# Patient Record
Sex: Female | Born: 1985 | Race: Black or African American | Hispanic: No | Marital: Single | Smoking: Never smoker
Health system: Southern US, Community
[De-identification: ages and names within clinical notes are randomized; demographics above are authoritative.]

---

## 2019-08-22 ENCOUNTER — Encounter (HOSPITAL_COMMUNITY): Payer: Self-pay | Admitting: Emergency Medicine

## 2019-08-22 ENCOUNTER — Emergency Department (HOSPITAL_COMMUNITY)
Admission: EM | Admit: 2019-08-22 | Discharge: 2019-08-22 | Disposition: A | Discharge status: Home / Self Care | Payer: Self-pay | Attending: Emergency Medicine | Admitting: Emergency Medicine

## 2019-08-22 ENCOUNTER — Other Ambulatory Visit: Payer: Self-pay

## 2019-08-22 DIAGNOSIS — R531 Weakness: Secondary | ICD-10-CM | POA: Insufficient documentation

## 2019-08-22 DIAGNOSIS — Z1152 Encounter for screening for COVID-19: Secondary | ICD-10-CM

## 2019-08-22 DIAGNOSIS — Z20822 Contact with and (suspected) exposure to covid-19: Secondary | ICD-10-CM | POA: Insufficient documentation

## 2019-08-22 NOTE — ED Triage Notes (Addendum)
Arrived via [facility] from public location [facility] X 6 years with occasional abd pain. Pt denies SOB fever cough LOC or syncope. Pt ambulatory on scene A/OX4.

## 2019-08-22 NOTE — ED Notes (Signed)
Rn gave pt discharge instructions. Pt asks repeatedly about Covid testing and how the tests are performed. Pt states that she has gum cancer and that nobody is sticking anything up her nose. 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 Pt asks if we are able to call another ambulance to take her to the next hospital. Pt advised that the next hospitals in the area are [facility] facilities and they would not provide that type of Covid test either.  Pt advised RN and this Clinical research associate that she has been having issues with the [employer] and several proscpective employers wanting her to get a Covid test and that she wants a note stating that this hospital system only does nasal Covid swabs. Rn advises that the only note she can give is a Doctor's note stating that she was seen in the ED. Pt educated by [employer]. Pt able to get dressed on her own and left without issue.

## 2019-08-22 NOTE — ED Provider Notes (Signed)
[facility]-EMERGENCY DEPT Provider Note   CSN: [id number] Arrival date & time: 08/22/19  2023     History Chief Complaint  Patient presents with  . Weakness    General     Diana Bates is a 34 y.o. female.  Patient presents to the ED with a chief complaint of requesting COVID-19 test.  She states that she has been feeling fatigued for the past 6 years.  She denies fever, SOB, cough.  She is requesting rectal covid test.    The history is provided by the patient. No language interpreter was used.       No past medical history on file.  There are no problems to display for this patient.   History reviewed. No pertinent surgical history.   OB History   No obstetric history on file.     No family history on file.  Social History   Tobacco Use  . Smoking status: Not on file  Substance Use Topics  . Alcohol use: Not on file  . Drug use: Not on file    Home Medications Prior to Admission medications   Medication Sig Start Date End Date Taking? Authorizing Provider  acetaminophen (TYLENOL) 325 MG tablet Take 650 mg by mouth 2 (two) times daily as needed for mild pain.   Yes [provider]  naproxen (NAPROSYN) 500 MG tablet Take 500 mg by mouth 2 (two) times daily as needed for mild pain.   Yes [provider]    Allergies    Patient has no known allergies.  Review of Systems   Review of Systems  Constitutional: Positive for fatigue. Negative for fever.  Respiratory: Negative for cough and shortness of breath.     Physical Exam Updated Vital Signs BP 106/69   Pulse (!) 103   Temp 98.1 F (36.7 C)   Resp 18   SpO2 100%   Physical Exam Vitals and nursing note reviewed.  Constitutional:      General: She is not in acute distress.    Appearance: She is well-developed.  HENT:     Head: Normocephalic and atraumatic.  Eyes:     Conjunctiva/sclera: Conjunctivae normal.  Cardiovascular:     Rate and Rhythm:  Normal rate.     Heart sounds: No murmur.  Pulmonary:     Effort: Pulmonary effort is normal. No respiratory distress.  Abdominal:     General: There is no distension.  Musculoskeletal:     Cervical back: Neck supple.     Comments: Moves all extremities  Skin:    General: Skin is warm and dry.  Neurological:     Mental Status: She is alert and oriented to person, place, and time.     ED Results / Procedures / Treatments   Labs (all labs ordered are listed, but only abnormal results are displayed) Labs Reviewed - No data to display  EKG None  Radiology No results found.  Procedures Procedures (including critical care time)  Medications Ordered in ED Medications - No data to display  ED Course  I have reviewed the triage vital signs and the nursing notes.  Pertinent labs & imaging results that were available during my care of the patient were reviewed by me and considered in my medical decision making (see chart for details).    MDM Rules/Calculators/A&P                      Patient here requesting COVID-19 screening.  She is noted to be afebrile, has normal O2 saturation, and is in no respiratory distress.  Additionally, she states that she has been feeling fatigued for the past 6 years.  She refuses Covid test unless it is done rectally. I told her that we don't do it that way here. She is then refused any other care from me.   Appears stable for discharge.  Medical screening exam complete.   Final Clinical Impression(s) / ED Diagnoses Final diagnoses:  Encounter for screening for COVID-19    Rx / DC Orders ED Discharge Orders    None       Montine Circle, PA-C 08/22/19 2226    Ezequiel Essex, MD 08/22/19 2341

## 2019-08-22 NOTE — ED Notes (Signed)
Patient stating she is having no symptoms of Covid-19 but wanting to be swabbed because she is tired of everyone talking about it. Patient stating she has gum cancer and does not want a Covid-19 swab in her nose, she wants it in her rectum. This nurse, EMT Kady, and provider have all explained that we only test by nasal swab. Patient stating they have been doing rectal Covid swabs for years. Education provided and patient agreed with discharge.

## 2019-08-23 ENCOUNTER — Other Ambulatory Visit: Payer: Self-pay

## 2019-08-23 DIAGNOSIS — R1012 Left upper quadrant pain: Secondary | ICD-10-CM | POA: Insufficient documentation

## 2019-08-23 NOTE — ED Notes (Signed)
Called pt x1 from lobby No response 

## 2019-08-24 ENCOUNTER — Emergency Department (HOSPITAL_COMMUNITY)
Admission: EM | Admit: 2019-08-24 | Discharge: 2019-08-24 | Disposition: A | Discharge status: Home / Self Care | Payer: Self-pay | Attending: Emergency Medicine | Admitting: Emergency Medicine

## 2019-08-24 ENCOUNTER — Encounter: Payer: Self-pay | Admitting: Emergency Medicine

## 2019-08-24 DIAGNOSIS — R1012 Left upper quadrant pain: Secondary | ICD-10-CM

## 2019-08-24 LAB — URINALYSIS, ROUTINE W REFLEX MICROSCOPIC
Bilirubin Urine: NEGATIVE
Glucose, UA: NEGATIVE mg/dL
Ketones, ur: NEGATIVE mg/dL
Leukocytes,Ua: NEGATIVE
Nitrite: NEGATIVE
Protein, ur: NEGATIVE mg/dL
Specific Gravity, Urine: 1.004 — ABNORMAL LOW (ref 1.005–1.030)
pH: 6 (ref 5.0–8.0)

## 2019-08-24 LAB — COMPREHENSIVE METABOLIC PANEL
ALT: 14 U/L (ref 0–44)
AST: 20 U/L (ref 15–41)
Albumin: 4.2 g/dL (ref 3.5–5.0)
Alkaline Phosphatase: 96 U/L (ref 38–126)
Anion gap: 8 (ref 5–15)
BUN: 16 mg/dL (ref 6–20)
CO2: 26 mmol/L (ref 22–32)
Calcium: 9.2 mg/dL (ref 8.9–10.3)
Chloride: 105 mmol/L (ref 98–111)
Creatinine, Ser: 0.83 mg/dL (ref 0.44–1.00)
GFR calc Af Amer: >60 mL/min (ref >60)
GFR calc non Af Amer: >60 mL/min (ref >60)
Glucose, Bld: 87 mg/dL (ref 70–99)
Potassium: 3.5 mmol/L (ref 3.5–5.1)
Sodium: 139 mmol/L (ref 135–145)
Total Bilirubin: 0.6 mg/dL (ref 0.3–1.2)
Total Protein: 7.5 g/dL (ref 6.5–8.1)

## 2019-08-24 LAB — CBC WITH DIFFERENTIAL/PLATELET
Abs Immature Granulocytes: 0.03 10*3/uL (ref 0.00–0.07)
Basophils Absolute: 0 10*3/uL (ref 0.0–0.1)
Basophils Relative: 0 %
Eosinophils Absolute: 0.2 10*3/uL (ref 0.0–0.5)
Eosinophils Relative: 2 %
HCT: 34.9 % — ABNORMAL LOW (ref 36.0–46.0)
Hemoglobin: 10.4 g/dL — ABNORMAL LOW (ref 12.0–15.0)
Immature Granulocytes: 0 %
Lymphocytes Relative: 30 %
Lymphs Abs: 2.7 10*3/uL (ref 0.7–4.0)
MCH: 24.1 pg — ABNORMAL LOW (ref 26.0–34.0)
MCHC: 29.8 g/dL — ABNORMAL LOW (ref 30.0–36.0)
MCV: 81 fL (ref 80.0–100.0)
Monocytes Absolute: 0.6 10*3/uL (ref 0.1–1.0)
Monocytes Relative: 7 %
Neutro Abs: 5.3 10*3/uL (ref 1.7–7.7)
Neutrophils Relative %: 61 %
Platelets: 379 10*3/uL (ref 150–400)
RBC: 4.31 MIL/uL (ref 3.87–5.11)
RDW: 15.9 % — ABNORMAL HIGH (ref 11.5–15.5)
WBC: 8.9 10*3/uL (ref 4.0–10.5)
nRBC: 0 % (ref 0.0–0.2)

## 2019-08-24 LAB — LIPASE, BLOOD: Lipase: 28 U/L (ref 11–51)

## 2019-08-24 MED ORDER — ALUM & MAG HYDROXIDE-SIMETH 200-200-20 MG/5ML PO SUSP
30.0000 mL | Freq: Once | ORAL | Status: AC
  Administered 2019-08-24: 30 mL via ORAL
  Filled 2019-08-24: qty 30

## 2019-08-24 MED ORDER — ONDANSETRON 4 MG PO TBDP
4.0000 mg | ORAL_TABLET | Freq: Once | ORAL | Status: AC
  Administered 2019-08-24: 4 mg via ORAL
  Filled 2019-08-24: qty 1

## 2019-08-24 NOTE — ED Triage Notes (Signed)
Pt arriving with abdominal pain. Pt giving vague answers when asked to describe the pain or what may have caused it. Pt reports not trying any medications at home to relieve symptoms prior to arrival.

## 2019-08-24 NOTE — Discharge Instructions (Addendum)

## 2019-08-24 NOTE — ED Provider Notes (Signed)
[facility]-EMERGENCY DEPT Provider Note  CSN: [id number] Arrival date & time: 08/23/19 2122  Chief Complaint(s) Abdominal Pain  HPI Diana Bates is a 34 y.o. female   The history is provided by the patient.  Abdominal Pain Pain location:  Generalized Pain quality: aching   Pain radiates to:  Does not radiate Pain severity:  Moderate Onset quality:  Gradual Duration:  1 day Timing:  Constant Progression:  Waxing and waning Chronicity:  Recurrent Relieved by:  Nothing Worsened by:  Nothing Associated symptoms: fatigue, nausea and vomiting   Associated symptoms: no chills, no cough, no diarrhea, no shortness of breath and no sore throat     Past Medical History No past medical history on file. There are no problems to display for this patient.  Home Medication(s) Prior to Admission medications   Medication Sig Start Date End Date Taking? Authorizing Provider  acetaminophen (TYLENOL) 325 MG tablet Take 650 mg by mouth 2 (two) times daily as needed for mild pain.   Yes [provider]  naproxen (NAPROSYN) 500 MG tablet Take 500 mg by mouth 2 (two) times daily as needed for mild pain.   Yes [provider]                                                                                                                                    Past Surgical History No past surgical history on file. Family History No family history on file.  Social History Social History   Tobacco Use  . Smoking status: Not on file  Substance Use Topics  . Alcohol use: Not on file  . Drug use: Not on file   Allergies Patient has no known allergies.  Review of Systems Review of Systems  Constitutional: Positive for fatigue. Negative for chills.  HENT: Negative for sore throat.   Respiratory: Negative for cough and shortness of breath.   Gastrointestinal: Positive for abdominal pain, nausea and vomiting. Negative for diarrhea.   All other systems  are reviewed and are negative for acute change except as noted in the HPI  Physical Exam Vital Signs  I have reviewed the triage vital signs BP 130/85   Pulse 69   Temp 98 F (36.7 C)   Resp 20   Ht 5\' 2"  (1.575 m)   Wt 81.6 kg   LMP 08/24/2019   SpO2 100%   BMI 32.92 kg/m   Physical Exam Vitals reviewed.  Constitutional:      General: She is not in acute distress.    Appearance: She is well-developed. She is not diaphoretic.  HENT:     Head: Normocephalic and atraumatic.     Right Ear: External ear normal.     Left Ear: External ear normal.     Nose: Nose normal.  Eyes:     General: No scleral icterus.    Conjunctiva/sclera: Conjunctivae normal.  Neck:  Trachea: Phonation normal.  Cardiovascular:     Rate and Rhythm: Normal rate and regular rhythm.  Pulmonary:     Effort: Pulmonary effort is normal. No respiratory distress.     Breath sounds: No stridor.  Abdominal:     General: There is no distension.     Tenderness: There is abdominal tenderness in the left upper quadrant. There is no guarding or rebound. Negative signs include Murphy's sign and McBurney's sign.  Musculoskeletal:        General: Normal range of motion.     Cervical back: Normal range of motion.  Neurological:     Mental Status: She is alert and oriented to person, place, and time.  Psychiatric:        Behavior: Behavior normal.     ED Results and Treatments Labs (all labs ordered are listed, but only abnormal results are displayed) Labs Reviewed  CBC WITH DIFFERENTIAL/PLATELET - Abnormal; Notable for the following components:      Result Value   Hemoglobin 10.4 (*)    HCT 34.9 (*)    MCH 24.1 (*)    MCHC 29.8 (*)    RDW 15.9 (*)    All other components within normal limits  URINALYSIS, ROUTINE W REFLEX MICROSCOPIC - Abnormal; Notable for the following components:   Color, Urine STRAW (*)    Specific Gravity, Urine 1.004 (*)    Hgb urine dipstick LARGE (*)    Bacteria, UA RARE  (*)    All other components within normal limits  COMPREHENSIVE METABOLIC PANEL  LIPASE, BLOOD                                                                                                                         EKG  EKG Interpretation  Date/Time:    Ventricular Rate:    PR Interval:    QRS Duration:   QT Interval:    QTC Calculation:   R Axis:     Text Interpretation:        Radiology No results found.  Pertinent labs & imaging results that were available during my care of the patient were reviewed by me and considered in my medical decision making (see chart for details).  Medications Ordered in ED Medications  ondansetron (ZOFRAN-ODT) disintegrating tablet 4 mg (4 mg Oral Given 08/24/19 0409)  alum & mag hydroxide-simeth (MAALOX/MYLANTA) 200-200-20 MG/5ML suspension 30 mL (30 mLs Oral Given 08/24/19 0409)  Procedures Procedures  (including critical care time)  Medical Decision Making / ED Course I have reviewed the nursing notes for this encounter and the patient's prior records (if available in EHR or on provided paperwork).   Diana Bates was evaluated in Emergency Department on 08/24/2019 for the symptoms described in the history of present illness. She was evaluated in the context of the global COVID-19 pandemic, which necessitated consideration that the patient might be at risk for infection with the SARS-CoV-2 virus that causes COVID-19. Institutional protocols and algorithms that pertain to the evaluation of patients at risk for COVID-19 are in a state of rapid change based on information released by regulatory bodies including the CDC and federal and state organizations. These policies and algorithms were followed during the patient's care in the ED.  Patient presents with abdominal discomfort.  Mild discomfort to palpation in the left  upper quadrant.  The rest of the abdomen is benign.  Labs grossly reassuring without leukocytosis or significant anemia.  No electrolyte derangements or renal insufficiency.  No evidence of bili obstruction or pancreatitis.  Pain resolved with GI cocktail.  Low suspicion for serious intra-abdominal findings or/infectious process requiring imaging at this time.  The patient appears reasonably screened and/or stabilized for discharge and I doubt any other medical condition or other [facility] requiring further screening, evaluation, or treatment in the ED at this time prior to discharge.  The patient is safe for discharge with strict return precautions.       Final Clinical Impression(s) / ED Diagnoses Final diagnoses:  Left upper quadrant abdominal pain    The patient appears reasonably screened and/or stabilized for discharge and I doubt any other medical condition or other [facility] requiring further screening, evaluation, or treatment in the ED at this time prior to discharge.  Disposition: Discharge  Condition: Good  I have discussed the results, Dx and Tx plan with the patient who expressed understanding and agree(s) with the plan. Discharge instructions discussed at great length. The patient was given strict return precautions who verbalized understanding of the instructions. No further questions at time of discharge.    ED Discharge Orders    None       Follow Up: Primary care provider  Schedule an appointment as soon as possible for a visit  If you do not have a primary care physician, contact [facility] at [phone] for referral      This chart was dictated using voice recognition software.  Despite best efforts to proofread,  errors can occur which can change the documentation meaning.   Nira Conn, MD 08/24/19 [phone]

## 2019-08-25 ENCOUNTER — Encounter (HOSPITAL_COMMUNITY): Payer: Self-pay | Admitting: *Deleted

## 2019-08-25 ENCOUNTER — Emergency Department (EMERGENCY_DEPARTMENT_HOSPITAL)
Admission: AD | Admit: 2019-08-25 | Discharge: 2019-08-25 | Disposition: A | Discharge status: Home / Self Care | Payer: Self-pay | Source: Home / Self Care | Admitting: Obstetrics & Gynecology

## 2019-08-25 ENCOUNTER — Emergency Department (HOSPITAL_COMMUNITY)
Admission: EM | Admit: 2019-08-25 | Discharge: 2019-08-26 | Disposition: A | Discharge status: Home / Self Care | Payer: Self-pay | Attending: Emergency Medicine | Admitting: Emergency Medicine

## 2019-08-25 ENCOUNTER — Other Ambulatory Visit: Payer: Self-pay

## 2019-08-25 DIAGNOSIS — R519 Headache, unspecified: Secondary | ICD-10-CM | POA: Insufficient documentation

## 2019-08-25 DIAGNOSIS — R1032 Left lower quadrant pain: Secondary | ICD-10-CM | POA: Insufficient documentation

## 2019-08-25 DIAGNOSIS — R109 Unspecified abdominal pain: Secondary | ICD-10-CM

## 2019-08-25 DIAGNOSIS — G8929 Other chronic pain: Secondary | ICD-10-CM | POA: Insufficient documentation

## 2019-08-25 DIAGNOSIS — Z3202 Encounter for pregnancy test, result negative: Secondary | ICD-10-CM

## 2019-08-25 LAB — URINALYSIS, ROUTINE W REFLEX MICROSCOPIC
Bilirubin Urine: NEGATIVE
Glucose, UA: NEGATIVE mg/dL
Ketones, ur: 5 mg/dL — AB
Nitrite: NEGATIVE
Protein, ur: 30 mg/dL — AB
Specific Gravity, Urine: 1.03 (ref 1.005–1.030)
pH: 5 (ref 5.0–8.0)

## 2019-08-25 LAB — COMPREHENSIVE METABOLIC PANEL
ALT: 14 U/L (ref 0–44)
AST: 27 U/L (ref 15–41)
Albumin: 3.7 g/dL (ref 3.5–5.0)
Alkaline Phosphatase: 111 U/L (ref 38–126)
Anion gap: 12 (ref 5–15)
BUN: 11 mg/dL (ref 6–20)
CO2: 23 mmol/L (ref 22–32)
Calcium: 9.1 mg/dL (ref 8.9–10.3)
Chloride: 106 mmol/L (ref 98–111)
Creatinine, Ser: 0.77 mg/dL (ref 0.44–1.00)
GFR calc Af Amer: >60 mL/min (ref >60)
GFR calc non Af Amer: >60 mL/min (ref >60)
Glucose, Bld: 127 mg/dL — ABNORMAL HIGH (ref 70–99)
Potassium: 3.8 mmol/L (ref 3.5–5.1)
Sodium: 141 mmol/L (ref 135–145)
Total Bilirubin: 0.6 mg/dL (ref 0.3–1.2)
Total Protein: 6.4 g/dL — ABNORMAL LOW (ref 6.5–8.1)

## 2019-08-25 LAB — CBC
HCT: 32.4 % — ABNORMAL LOW (ref 36.0–46.0)
Hemoglobin: 9.7 g/dL — ABNORMAL LOW (ref 12.0–15.0)
MCH: 24 pg — ABNORMAL LOW (ref 26.0–34.0)
MCHC: 29.9 g/dL — ABNORMAL LOW (ref 30.0–36.0)
MCV: 80.2 fL (ref 80.0–100.0)
Platelets: 370 10*3/uL (ref 150–400)
RBC: 4.04 MIL/uL (ref 3.87–5.11)
RDW: 15.6 % — ABNORMAL HIGH (ref 11.5–15.5)
WBC: 9.8 10*3/uL (ref 4.0–10.5)
nRBC: 0 % (ref 0.0–0.2)

## 2019-08-25 LAB — LIPASE, BLOOD: Lipase: 18 U/L (ref 11–51)

## 2019-08-25 LAB — POCT PREGNANCY, URINE: Preg Test, Ur: NEGATIVE

## 2019-08-25 MED ORDER — SODIUM CHLORIDE 0.9% FLUSH: 3.0000 mL | Freq: Once | INTRAVENOUS | Status: DC

## 2019-08-25 NOTE — ED Triage Notes (Signed)
Pt presents with lower abdominal pain "for months". Seen for the same yesterday in the [facility] ED, says the pain is "worse and not changed". No vaginal bleeding or discharge. No urinary symptoms. Has had vomiting today.

## 2019-08-25 NOTE — MAU Note (Signed)
Pt reports to MAU for stomach pain,  pelvic pain, and a headache located in the back of her head.

## 2019-08-25 NOTE — MAU Provider Note (Signed)
First Provider Initiated Contact with Patient 08/25/19 2111      S Ms. Diana Bates is a 34 y.o.  non-pregnant female who presents to MAU today with complaint of abdominal pain & requesting a pregnancy test. She was seen in the ED yesterday with complaint of abdominal pain. Had negative workup. Pregnancy test was not done at that time. States abdominal pain has not worsened or changed since her evaluation yesterday.   O BP 130/79 (BP Location: Right Arm)   Pulse 90   Temp 98.3 F (36.8 C) (Oral)   Resp 20   LMP 08/24/2019   SpO2 100%  Physical Exam  Nursing note and vitals reviewed. Constitutional: She appears well-developed and well-nourished. No distress.  Respiratory: Effort normal. No respiratory distress.  Skin: She is not diaphoretic.  Psychiatric: She has a normal mood and affect. Her behavior is normal.    A Non pregnant female Medical screening exam complete 1. Abdominal pain in female   2. Negative pregnancy test   -vital signs stable. Pregnancy test negative. Patient reports no worsening of symptoms.    P Discharge from MAU in stable condition Patient given the option of transfer to [facility] for further evaluation or seek care in outpatient facility of choice List of options for follow-up given  Warning signs for worsening condition that would warrant emergency follow-up discussed Patient may return to MAU as needed for pregnancy related complaints  Judeth Horn, NP 08/25/2019 9:11 PM

## 2019-08-26 ENCOUNTER — Other Ambulatory Visit: Payer: Self-pay

## 2019-08-26 ENCOUNTER — Emergency Department (HOSPITAL_COMMUNITY)
Admission: EM | Admit: 2019-08-26 | Discharge: 2019-08-27 | Disposition: A | Discharge status: Home / Self Care | Payer: Self-pay | Attending: Emergency Medicine | Admitting: Emergency Medicine

## 2019-08-26 DIAGNOSIS — F22 Delusional disorders: Secondary | ICD-10-CM | POA: Insufficient documentation

## 2019-08-26 DIAGNOSIS — R109 Unspecified abdominal pain: Secondary | ICD-10-CM | POA: Insufficient documentation

## 2019-08-26 DIAGNOSIS — R519 Headache, unspecified: Secondary | ICD-10-CM | POA: Insufficient documentation

## 2019-08-26 NOTE — ED Provider Notes (Signed)
[facility] EMERGENCY DEPARTMENT Provider Note   CSN: [id number] Arrival date & time: 08/25/19  2131     History Chief Complaint  Patient presents with  . Abdominal Pain    Diana Bates is a 34 y.o. female with no significant past medical history who presents to the emergency department with a chief complaint of abdominal pain.  The patient reports that she has been having left lower quadrant abdominal pain that has been intermittent "for a long time".  She later states that it has been going on for years.  She is unsure if it is gotten worse recently, but just wanted to be reevaluated because she has not been feeling well.  She also reports that she developed a headache earlier today.  She denies fever, chills, cough, shortness of breath, chest pain, back pain, nausea, vomiting, diarrhea, constipation, dysuria, hematuria, vaginal pain, or discharge.  LMP 08/24/19.  Reports that she has not been sexually active in several months.  She is not currently established with a primary care provider or OB/GYN.  Reports that she is to have a history of ovarian cyst.  She states that she cannot tell me if she has had any other chronic medical conditions "because I'm not a doctor."  She does not take any daily medications.  The history is provided by the patient. No language interpreter was used.       History reviewed. No pertinent past medical history.  There are no problems to display for this patient.   History reviewed. No pertinent surgical history.   OB History   No obstetric history on file.     No family history on file.  Social History   Tobacco Use  . Smoking status: Not on file  Substance Use Topics  . Alcohol use: Not on file  . Drug use: Not on file    Home Medications Prior to Admission medications   Medication Sig Start Date End Date Taking? Authorizing Provider  acetaminophen (TYLENOL) 325 MG tablet Take 650 mg by mouth 2 (two) times daily  as needed for mild pain.   Yes [provider]  naproxen (NAPROSYN) 500 MG tablet Take 500 mg by mouth 2 (two) times daily as needed for mild pain.   Yes [provider]    Allergies    Patient has no known allergies.  Review of Systems   Review of Systems  Constitutional: Negative for activity change, chills and fever.  Respiratory: Negative for shortness of breath and wheezing.   Cardiovascular: Negative for chest pain and palpitations.  Gastrointestinal: Negative for abdominal pain, constipation, diarrhea, nausea and vomiting.  Genitourinary: Negative for decreased urine volume, dysuria, flank pain, frequency, genital sores, hematuria, pelvic pain, vaginal bleeding, vaginal discharge and vaginal pain.  Musculoskeletal: Negative for back pain and myalgias.  Skin: Negative for rash.  Allergic/Immunologic: Negative for immunocompromised state.  Neurological: Positive for headaches. Negative for dizziness, syncope, weakness, light-headedness and numbness.  Psychiatric/Behavioral: Negative for confusion.    Physical Exam Updated Vital Signs BP 113/74   Pulse 76   Temp 98.2 F (36.8 C) (Oral)   Resp 19   LMP 08/24/2019   SpO2 100%   Physical Exam Vitals and nursing note reviewed.  Constitutional:      General: She is not in acute distress.    Appearance: She is not ill-appearing, toxic-appearing or diaphoretic.  HENT:     Head: Normocephalic.  Eyes:     Conjunctiva/sclera: Conjunctivae normal.  Cardiovascular:  Rate and Rhythm: Normal rate and regular rhythm.     Heart sounds: No murmur. No friction rub. No gallop.   Pulmonary:     Effort: Pulmonary effort is normal. No respiratory distress.     Breath sounds: No stridor. No wheezing, rhonchi or rales.  Chest:     Chest wall: No tenderness.  Abdominal:     General: There is no distension.     Palpations: Abdomen is soft. There is no mass.     Tenderness: There is abdominal tenderness. There is no  right CVA tenderness, left CVA tenderness, guarding or rebound.     Hernia: No hernia is present.     Comments: Tender to palpation in the left lower quadrant.  No rebound or guarding.  No suprapubic tenderness.  No CVA tenderness bilaterally.  Normoactive bowel sounds.  Negative Murphy sign.  No tenderness over McBurney's point.  Musculoskeletal:     Cervical back: Neck supple.  Skin:    General: Skin is warm.     Findings: No rash.  Neurological:     Mental Status: She is alert.  Psychiatric:        Behavior: Behavior normal.     ED Results / Procedures / Treatments   Labs (all labs ordered are listed, but only abnormal results are displayed) Labs Reviewed  COMPREHENSIVE METABOLIC PANEL - Abnormal; Notable for the following components:      Result Value   Glucose, Bld 127 (*)    Total Protein 6.4 (*)    All other components within normal limits  CBC - Abnormal; Notable for the following components:   Hemoglobin 9.7 (*)    HCT 32.4 (*)    MCH 24.0 (*)    MCHC 29.9 (*)    RDW 15.6 (*)    All other components within normal limits  URINALYSIS, ROUTINE W REFLEX MICROSCOPIC - Abnormal; Notable for the following components:   APPearance HAZY (*)    Hgb urine dipstick MODERATE (*)    Ketones, ur 5 (*)    Protein, ur 30 (*)    Leukocytes,Ua MODERATE (*)    Bacteria, UA RARE (*)    All other components within normal limits  URINE CULTURE  LIPASE, BLOOD    EKG None  Radiology No results found.  Procedures Procedures (including critical care time)  Medications Ordered in ED Medications  sodium chloride flush (NS) 0.9 % injection 3 mL (has no administration in time range)    ED Course  I have reviewed the triage vital signs and the nursing notes.  Pertinent labs & imaging results that were available during my care of the patient were reviewed by me and considered in my medical decision making (see chart for details).    MDM Rules/Calculators/A&P                       34 year old female who presents for her fourth ER visit this week.  She has new order healthcare system.  Per chart review, it appears that she suffers from homelessness.  She is here to be evaluated today, again, for abdominal pain.  No GU symptoms.  No constitutional symptoms.  Vital signs are normal.  She does have anemia, I suspect this is probably chronic secondary to heavy menstrual cycles.  She is currently on her menstrual cycle, but no dizziness or lightheadedness.  UA with moderate leukocyte esterase.  This is new from yesterday, but it also may be secondary to contamination  as there are several squamous cells.  Will send for culture and defer treatment at this time as she is not having any urinary complaints.  The patient was offered Tylenol or ibuprofen for her symptoms today, but declined and states "can't you give me something stronger".  At this time, I feel that the patient is hemodynamically stable and in no acute distress.  Doubt appendicitis, diverticulitis, cholecystitis, pancreatitis, ovarian torsion given the chronicity of her symptoms, ectopic pregnancy, or pyelonephritis.  She is hemodynamically stable and in no acute distress.  Safe for discharge with outpatient follow-up.  Final Clinical Impression(s) / ED Diagnoses Final diagnoses:  Chronic abdominal pain  Acute nonintractable headache, unspecified headache type    Rx / DC Orders ED Discharge Orders    None       Joanne Gavel, PA-C 08/26/19 0417    Maudie Flakes, MD 08/31/19 [phone]

## 2019-08-26 NOTE — ED Notes (Signed)
This RN attempted to remove pt bracelet off of pt wrist. Pt ripped her arm away from RN and said "oh no, I am keeping this on because I am still here. Get out of my face."

## 2019-08-26 NOTE — ED Triage Notes (Signed)
Per pt she has been having a headache and was here last night and was not given any med. All strengths equal. No blurred no cough, no congestion

## 2019-08-26 NOTE — Discharge Instructions (Addendum)
Thank you for allowing me to care for you today in the Emergency Department.   You were seen today for lower abdominal pain that you have been having for some time.  You were also evaluated for headache that you developed earlier today.  You declined Tylenol and ibuprofen in the ER.  Call to get established with an OB/GYN to follow-up for your chronic abdominal pain.  Return to the emergency department if you pass out, if you develop uncontrollable bleeding, high fevers with severe abdominal pain, rectal bleeding, uncontrollable vomiting, or other new, concerning symptoms.

## 2019-08-27 ENCOUNTER — Emergency Department (HOSPITAL_COMMUNITY): Payer: Self-pay

## 2019-08-27 ENCOUNTER — Encounter (HOSPITAL_COMMUNITY): Payer: Self-pay | Admitting: Emergency Medicine

## 2019-08-27 ENCOUNTER — Other Ambulatory Visit: Payer: Self-pay

## 2019-08-27 ENCOUNTER — Emergency Department (HOSPITAL_COMMUNITY)
Admission: EM | Admit: 2019-08-27 | Discharge: 2019-08-27 | Disposition: A | Discharge status: Home / Self Care | Payer: Self-pay | Attending: Emergency Medicine | Admitting: Emergency Medicine

## 2019-08-27 DIAGNOSIS — Z79899 Other long term (current) drug therapy: Secondary | ICD-10-CM | POA: Insufficient documentation

## 2019-08-27 DIAGNOSIS — R519 Headache, unspecified: Secondary | ICD-10-CM | POA: Insufficient documentation

## 2019-08-27 LAB — URINE CULTURE

## 2019-08-27 MED ORDER — ACETAMINOPHEN 325 MG PO TABS
650.0000 mg | ORAL_TABLET | Freq: Once | ORAL | Status: AC
  Administered 2019-08-27: 650 mg via ORAL
  Filled 2019-08-27: qty 2

## 2019-08-27 MED ORDER — KETOROLAC TROMETHAMINE 60 MG/2ML IM SOLN
30.0000 mg | Freq: Once | INTRAMUSCULAR | Status: AC
  Administered 2019-08-27: 01:00:00 30 mg via INTRAMUSCULAR
  Filled 2019-08-27: qty 2

## 2019-08-27 MED ORDER — KETOROLAC TROMETHAMINE 60 MG/2ML IM SOLN
60.0000 mg | Freq: Once | INTRAMUSCULAR | Status: AC
  Administered 2019-08-27: 60 mg via INTRAMUSCULAR
  Filled 2019-08-27: qty 2

## 2019-08-27 NOTE — ED Triage Notes (Signed)
Patient here from home with complaints of headache, chills, body aches that started 4 days ago. States that she was seen for same at [facility] twice.would like covid test.

## 2019-08-27 NOTE — Discharge Instructions (Addendum)
You were seen today for headache and cough. Your chest xray was normal! We have given you some medications for your headache. Because these may be early symptoms of covid 19, you should stay home and isolate yourself for 10 days from your symptoms start date and until you are improved and fever free. Take tylenol for your symptoms. Thank you for allowing me to care for you today. Please return to the emergency department if you have new or worsening symptoms. Take your medications as instructed.

## 2019-08-27 NOTE — ED Provider Notes (Signed)
[facility] DEPT Provider Note   CSN: [id number] Arrival date & time: 08/27/19  2025     History Chief Complaint  Patient presents with  . Headache  . Chills    Diana Bates is a 34 y.o. female.  Patient is a 34 year old female with no listed past medical history presenting to the emergency department for headache, body aches and requesting Covid testing.  This appears to be patient's fifth or sixth visit in the last 5 days to the emergency department for various complaints including abdominal pain and Covid symptoms.  Previous Covid test negative and previous lab work-ups are unremarkable.  She reports that her symptoms have been going on for "a few days".  Reports generalized body aches and fatigue and a slight cough with a headache.  She denies any neurological symptoms.  Denies any fever, chills, nausea, vomiting, dysuria, abdominal pain, chest pain, shortness of breath.  She reports she has not taken anything today for relief of her headache.        History reviewed. No pertinent past medical history.  There are no problems to display for this patient.   History reviewed. No pertinent surgical history.   OB History   No obstetric history on file.     No family history on file.  Social History   Tobacco Use  . Smoking status: Never Smoker  . Smokeless tobacco: Never Used  Substance Use Topics  . Alcohol use: Never  . Drug use: Never    Home Medications Prior to Admission medications   Medication Sig Start Date End Date Taking? Authorizing Provider  acetaminophen (TYLENOL) 325 MG tablet Take 650 mg by mouth 2 (two) times daily as needed for mild pain.   Yes [provider]  naproxen (NAPROSYN) 500 MG tablet Take 500 mg by mouth 2 (two) times daily as needed for mild pain.   Yes [provider]    Allergies    Patient has no known allergies.  Review of Systems   Review of Systems  Constitutional: Negative  for appetite change, chills and fever.  HENT: Negative for congestion, rhinorrhea and sore throat.   Respiratory: Positive for cough. Negative for shortness of breath.   Cardiovascular: Negative for chest pain.  Gastrointestinal: Negative for abdominal pain, nausea and vomiting.  Genitourinary: Negative for dysuria.  Musculoskeletal: Positive for arthralgias and myalgias. Negative for back pain.  Skin: Negative for rash and wound.  Neurological: Positive for headaches. Negative for dizziness, syncope, light-headedness and numbness.  Hematological: Does not bruise/bleed easily.    Physical Exam Updated Vital Signs BP 140/85 (BP Location: Right Arm)   Pulse 97   Temp 98 F (36.7 C) (Oral)   Resp 16   Ht 5\' 2"  (1.575 m)   Wt 81.6 kg   LMP 08/24/2019   SpO2 100%   BMI 32.92 kg/m   Physical Exam Vitals and nursing note reviewed.  Constitutional:      General: She is not in acute distress.    Appearance: Normal appearance. She is well-developed. She is not ill-appearing, toxic-appearing or diaphoretic.  HENT:     Head: Normocephalic.  Eyes:     Extraocular Movements:     Right eye: Normal extraocular motion and no nystagmus.     Left eye: Normal extraocular motion and no nystagmus.     Conjunctiva/sclera: Conjunctivae normal.  Cardiovascular:     Rate and Rhythm: Normal rate and regular rhythm.  Pulmonary:     Effort: Pulmonary  effort is normal. No respiratory distress.     Breath sounds: Normal breath sounds.  Abdominal:     General: Bowel sounds are normal. There is no distension.     Palpations: Abdomen is soft.     Tenderness: There is no abdominal tenderness. There is no guarding.  Skin:    General: Skin is warm and dry.     Capillary Refill: Capillary refill takes less than 2 seconds.  Neurological:     Mental Status: She is alert.     Cranial Nerves: No cranial nerve deficit.     Sensory: No sensory deficit.     Motor: No weakness.  Psychiatric:        Mood  and Affect: Mood normal.     ED Results / Procedures / Treatments   Labs (all labs ordered are listed, but only abnormal results are displayed) Labs Reviewed  SARS CORONAVIRUS 2 (TAT 6-24 HRS)    EKG None  Radiology DG Chest Portable 1 View  Result Date: 08/27/2019 CLINICAL DATA:  Cough, headache, chills and body aches EXAM: PORTABLE CHEST 1 VIEW COMPARISON:  None. FINDINGS: No consolidation, features of edema, pneumothorax, or effusion. Pulmonary vascularity is normally distributed. The cardiomediastinal contours are unremarkable. No acute osseous or soft tissue abnormality. IMPRESSION: No acute cardiopulmonary abnormality. Electronically Signed   By: Kreg Shropshire M.D.   On: 08/27/2019 22:17    Procedures Procedures (including critical care time)  Medications Ordered in ED Medications  ketorolac (TORADOL) injection 60 mg (60 mg Intramuscular Given 08/27/19 2211)  acetaminophen (TYLENOL) tablet 650 mg (650 mg Oral Given 08/27/19 2211)    ED Course  I have reviewed the triage vital signs and the nursing notes.  Pertinent labs & imaging results that were available during my care of the patient were reviewed by me and considered in my medical decision making (see chart for details).  Clinical Course as of Aug 26 2238  Wynelle Link Aug 27, 2019  2222 Patient presenting with headache and body aches.  Patient has had multiple ED visits for similar vague complaints over the last several days.  Work-up including blood work has been negative at previous visits.  Chest x-ray today was negative.  Initially she was requesting Covid test.  Advised her we could do Covid swab and it was ordered.  When the nurse went to swab her she then declined the test because she did not want a nasal swab.  Patient appears very well and with normal vital signs.  She has had a negative physical exam.  She was given Toradol and Tylenol for her headache here today.  She is stable for discharge and advised to follow-up with  primary care doctor. TOC consult placed due to multiple ED visits   [KM]    Clinical Course User Index [KM] Jeral Pinch   MDM Rules/Calculators/A&P                      Based on review of vitals, medical screening exam, lab work and/or imaging, there does not appear to be an acute, emergent etiology for the patient's symptoms. Counseled pt on good return precautions and encouraged both PCP and ED follow-up as needed.  Prior to discharge, I also discussed incidental imaging findings with patient in detail and advised appropriate, recommended follow-up in detail.  Clinical Impression: 1. Nonintractable headache, unspecified chronicity pattern, unspecified headache type     Disposition: Discharge  Prior to providing a prescription for a  controlled substance, I independently reviewed the patient's recent prescription history on the [place] Controlled Substance Reporting System. The patient had no recent or regular prescriptions and was deemed appropriate for a brief, less than 3 day prescription of narcotic for acute analgesia.  This note was prepared with assistance of Conservation officer, historic buildings. Occasional wrong-word or sound-a-like substitutions may have occurred due to the inherent limitations of voice recognition software.  Final Clinical Impression(s) / ED Diagnoses Final diagnoses:  Nonintractable headache, unspecified chronicity pattern, unspecified headache type    Rx / DC Orders ED Discharge Orders    None       Jeral Pinch 08/27/19 2240    Terrilee Files, MD 08/28/19 [phone]

## 2019-08-27 NOTE — ED Notes (Signed)
Patient verbalizes understanding of discharge instructions. Opportunity for questioning and answers were provided. Armband removed by staff, pt discharged from ED ambulatory to home.  

## 2019-08-27 NOTE — ED Notes (Signed)
Pt declined COVID testing after seeing that it would be done nasally. Provider notified.

## 2019-08-28 NOTE — Progress Notes (Signed)
08/28/2019 2:04 pm TOC CM received referral to assist with PCP appt. Recent ED visits with abdominal pain. Contacted pt and left HIPAA compliant message for return call. Isidoro Donning RN CCM, [facility] ED TOC CM [phone]

## 2019-08-28 NOTE — ED Provider Notes (Signed)
Emergency Department Provider Note   I have reviewed the triage vital signs and the nursing notes.   HISTORY  Chief Complaint Headache   HPI Diana Bates is a 34 y.o. female who presents the emergency department today with nonspecific symptoms.  She is bringing multiple times in the last week for similar nonspecific symptoms and bizarre requests.  Today she complains of her chronic abdominal pain again that has not changed in the last 6 months.  She also complains of a headache that she had for last few days that has not been addressed.  She has no neurologic symptoms.  She has not tried taking any as.  Living shin and why the sudden increase in frequency of ER visits patient is very evasive.  Does not endorse any kind of abuse: Mental, physical or sexual.   No other associated or modifying symptoms.    No past medical history on file.  There are no problems to display for this patient.   No past surgical history on file.  Current Outpatient Rx  . Order #: [id number] Class: Historical Med  . Order #: [id number] Class: Historical Med    Allergies Patient has no known allergies.  No family history on file.  Social History Social History   Tobacco Use  . Smoking status: Never Smoker  . Smokeless tobacco: Never Used  Substance Use Topics  . Alcohol use: Never  . Drug use: Never    Review of Systems  All other systems negative except as documented in the HPI. All pertinent positives and negatives as reviewed in the HPI. ____________________________________________   PHYSICAL EXAM:  VITAL SIGNS: ED Triage Vitals  Enc Vitals Group     BP 08/26/19 2316 (!) 124/112     Pulse Rate 08/26/19 2316 80     Resp 08/26/19 2316 18     Temp 08/26/19 2316 98.1 F (36.7 C)     Temp Source 08/26/19 2316 Oral     SpO2 08/26/19 2316 100 %     Weight --      Height --      Head Circumference --      Peak Flow --      Pain Score 08/26/19 2323 9     Pain Loc --      Pain  Edu? --      Excl. in GC? --     Constitutional: Alert and oriented. Well appearing and in no acute distress. Eyes: Conjunctivae are normal. PERRL. EOMI. Head: Atraumatic. Nose: No congestion/rhinnorhea. Mouth/Throat: Mucous membranes are moist.  Oropharynx non-erythematous. Neck: No stridor.  No meningeal signs.   Cardiovascular: Normal rate, regular rhythm. Good peripheral circulation. Grossly normal heart sounds.   Respiratory: Normal respiratory effort.  No retractions. Lungs CTAB. Gastrointestinal: Soft and nontender. No distention.  Musculoskeletal: No lower extremity tenderness nor edema. No gross deformities of extremities. Neurologic:  Normal speech and language. No gross focal neurologic deficits are appreciated.  Skin:  Skin is warm, dry and intact. No rash noted. Psychiatric: Mood normal with a flat affect.  Somewhat delusional thinking that she has some gum cancer without a diagnosis.  Think that she needs a rectal Covid swab as it been doing it that way for multiple years.  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  ____________________________________________   RADIOLOGY  DG Chest Portable 1 View  Result Date: 08/27/2019 CLINICAL DATA:  Cough, headache, chills and body aches EXAM: PORTABLE CHEST 1 VIEW COMPARISON:  None. FINDINGS: No consolidation, features of edema, pneumothorax, or effusion. Pulmonary vascularity is normally distributed. The cardiomediastinal contours are unremarkable. No acute osseous or soft tissue abnormality. IMPRESSION: No acute cardiopulmonary abnormality. Electronically Signed   By: Lovena Le M.D.   On: 08/27/2019 22:17    ____________________________________________   PROCEDURES  Procedure(s) performed:   Procedures   ____________________________________________   INITIAL IMPRESSION / ASSESSMENT AND PLAN / ED COURSE  Low suspicion for any emergent cause for her chronic symptoms at this time.  Suspect  this is more of a social/psychiatric issue.  She is not suicidal or homicidal.  She does not have overt hallucinations or dangerous delusions/psychosis for me to warrant further work-up on that point.  I do think she is reported in the community she does agree with that.  She is agreeable to getting some Toradol here for her headache and following up with a primary care provider sometime.  Will engage social work and case management to try to avoid further ER visits.  Pertinent labs & imaging results that were available during my care of the patient were reviewed by me and considered in my medical decision making (see chart for details).  Diana Bates was evaluated in Emergency Department on 08/28/2019 for the symptoms described in the history of present illness. She was evaluated in the context of the global COVID-19 pandemic, which necessitated consideration that the patient might be at risk for infection with the SARS-CoV-2 virus that causes COVID-19. Institutional protocols and algorithms that pertain to the evaluation of patients at risk for COVID-19 are in a state of rapid change based on information released by regulatory bodies including the CDC and federal and state organizations. These policies and algorithms were followed during the patient's care in the ED.    A medical screening exam was performed and I feel the patient has had anappropriate workup for their chief complaint at this time and likelihood of emergent condition existing is low. They have been counseled on decision, discharge, follow up and which symptoms necessitate immediate return to the emergency department. They or their family verbally stated understanding and agreement with plan and discharged in stable condition.   ____________________________________________  FINAL CLINICAL IMPRESSION(S) / ED DIAGNOSES  Final diagnoses:  Nonintractable episodic headache, unspecified headache type     MEDICATIONS GIVEN DURING THIS  VISIT:  Medications  ketorolac (TORADOL) injection 30 mg (30 mg Intramuscular Given 08/27/19 0126)     NEW OUTPATIENT MEDICATIONS STARTED DURING THIS VISIT:  Discharge Medication List as of 08/27/2019  1:20 AM      Note:  This note was prepared with assistance of Dragon voice recognition software. Occasional wrong-word or sound-a-like substitutions may have occurred due to the inherent limitations of voice recognition software.   Nakyia Dau, Corene Cornea, MD 08/28/19 [phone]

## 2021-09-10 IMAGING — DX DG CHEST 1V PORT
1 series · 1 of 1 positions shown · non-contrast
Comparison: None.

CLINICAL DATA: Cough, headache, chills and body aches

EXAM:
PORTABLE CHEST 1 VIEW

[chest ap]
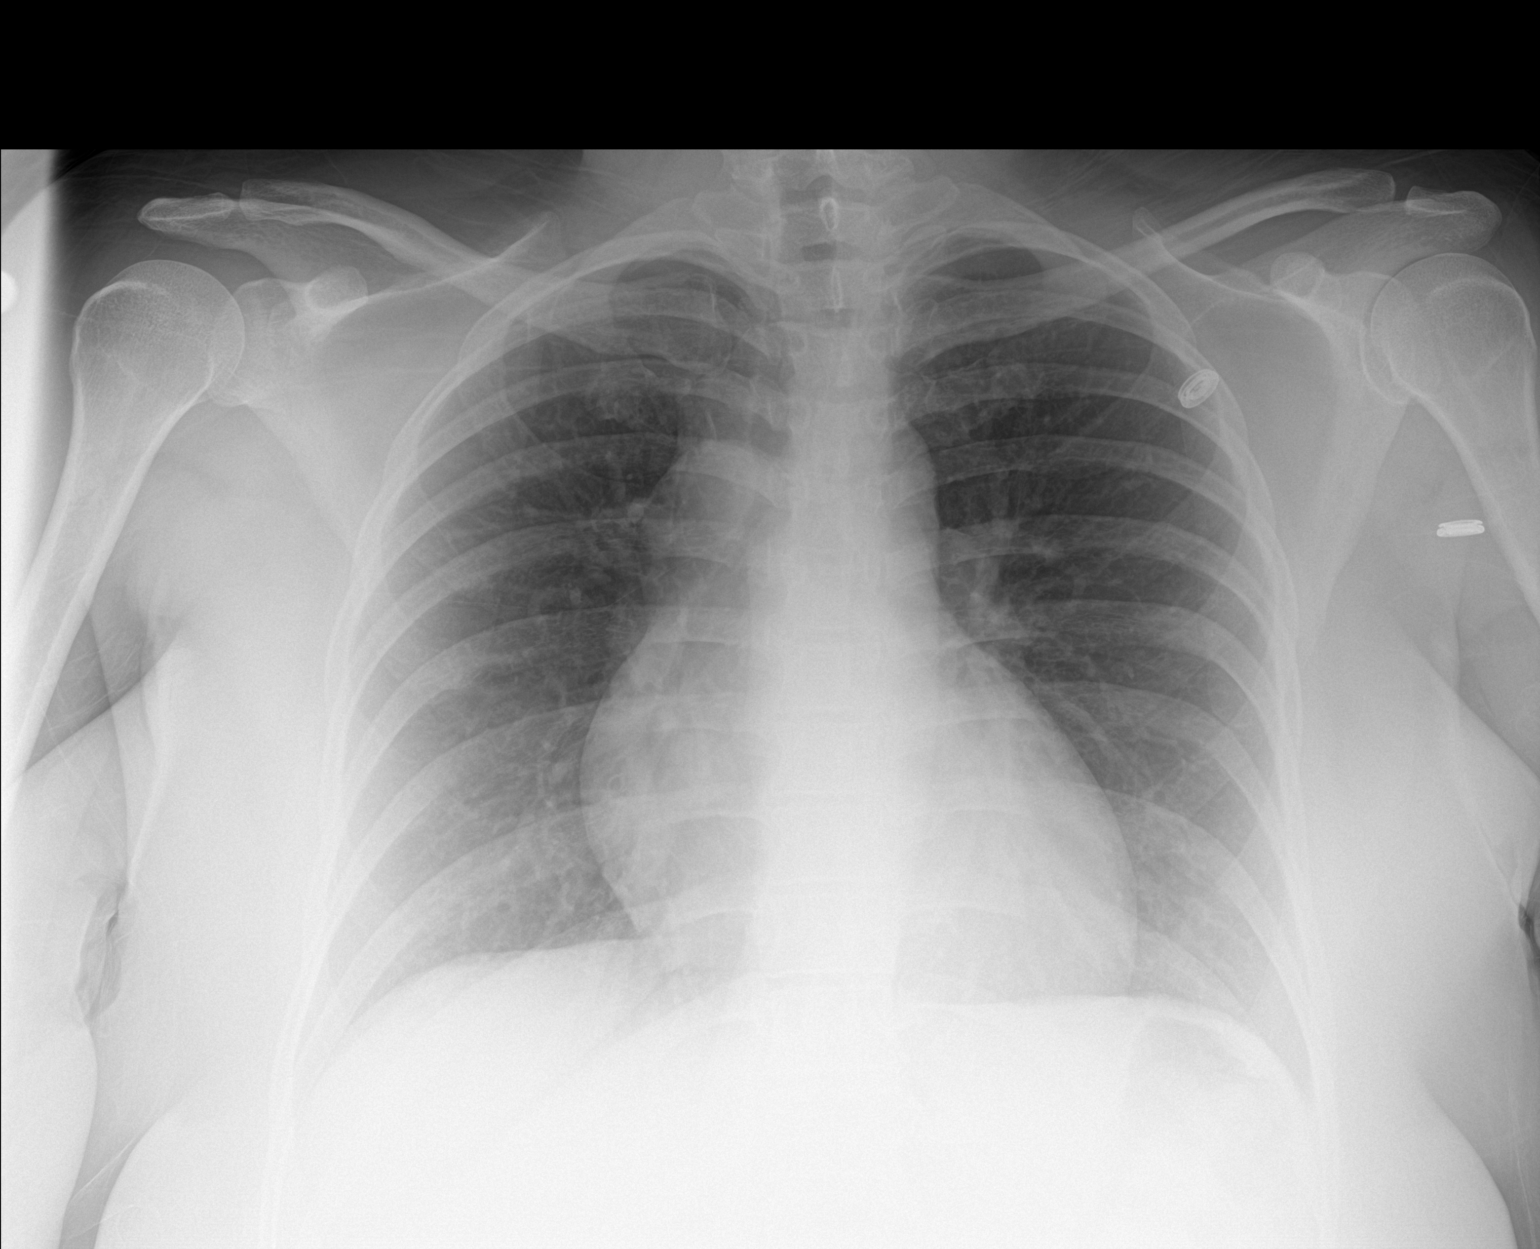

[1 of 1 positions shown; findings below may reference images not displayed]

FINDINGS: No consolidation, features of edema, pneumothorax, or effusion.
Pulmonary vascularity is normally distributed. The cardiomediastinal
contours are unremarkable. No acute osseous or soft tissue
abnormality.
IMPRESSION: No acute cardiopulmonary abnormality.
